# Patient Record
Sex: Female | Born: 1995 | Race: Asian | Hispanic: No | Marital: Single | Smoking: Never smoker
Health system: Southern US, Community
[De-identification: ages and names within clinical notes are randomized; demographics above are authoritative.]

## PROBLEM LIST (undated history)

## (undated) DIAGNOSIS — F32A Depression, unspecified: Secondary | ICD-10-CM

## (undated) DIAGNOSIS — J45909 Unspecified asthma, uncomplicated: Secondary | ICD-10-CM

## (undated) DIAGNOSIS — F329 Major depressive disorder, single episode, unspecified: Secondary | ICD-10-CM

## (undated) HISTORY — DX: Major depressive disorder, single episode, unspecified: F32.9

## (undated) HISTORY — DX: Depression, unspecified: F32.A

## (undated) HISTORY — DX: Unspecified asthma, uncomplicated: J45.909

## (undated) HISTORY — PX: APPENDECTOMY: SHX54

---
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
## 2013-03-02 ENCOUNTER — Encounter: Payer: Self-pay | Admitting: Radiology

## 2013-05-23 ENCOUNTER — Ambulatory Visit (INDEPENDENT_AMBULATORY_CARE_PROVIDER_SITE_OTHER): Payer: Managed Care, Other (non HMO) | Admitting: Family Medicine

## 2013-05-23 VITALS — BP 100/62 | HR 104 | Temp 98.4°F | Resp 18 | Ht 60.5 in | Wt 105.8 lb

## 2013-05-23 DIAGNOSIS — G43909 Migraine, unspecified, not intractable, without status migrainosus: Secondary | ICD-10-CM

## 2013-05-23 DIAGNOSIS — R110 Nausea: Secondary | ICD-10-CM

## 2013-05-23 DIAGNOSIS — R42 Dizziness and giddiness: Secondary | ICD-10-CM

## 2013-05-23 MED ORDER — TRAMADOL HCL 50 MG PO TABS: 50.0000 mg | ORAL_TABLET | Freq: Three times a day (TID) | ORAL | Status: DC | PRN

## 2013-05-23 NOTE — Progress Notes (Signed)
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 She describes them as migraine and the other normal headaches. They occur in the occiput or occipitoparietal areas. She gets nauseated and dizzy with the headaches. She has been on several medications from other physicians for a fairly long period of time including sumatriptan in which she takes about one a day, nortriptyline, and bupropion. She has problems with loud noises. Certain foods seem to aggravate things. Her mother does have migraines also. LMP 3 weeks ago.  Objective: Pleasant alert healthy appearing young lady in no major distress. TMs normal. Eyes PERRLA. Fundi benign. Cranial nerves 2-12 grossly intact. Romberg negative. Neck supple without nodes thyromegaly. No carotid bruits. Chest clear. Heart regular without murmurs.  Assessment: Nausea Migraines Dizziness  Plan: Avoid caffeine. Decrease frequency of sumatriptan and use. Headache analgesia with tramadol. Return if worse

## 2013-05-23 NOTE — Patient Instructions (Signed)
Decrease caffeine intake  Only use the sumatriptan once or twice a week if you have severe migraines. Take them too often this can cause rebound headaches  Tramadol every 8 hours when needed for severe pain  Take over-the-counter ibuprofen 200 mg, 3 tablets every 6 or 8 hours if needed for headache pain. Take with food.  Return if migraines or not improving

## 2013-10-05 ENCOUNTER — Ambulatory Visit (INDEPENDENT_AMBULATORY_CARE_PROVIDER_SITE_OTHER): Payer: Managed Care, Other (non HMO) | Admitting: Internal Medicine

## 2013-10-05 VITALS — BP 108/56 | HR 90 | Temp 98.5°F | Resp 18 | Ht 59.0 in | Wt 109.0 lb

## 2013-10-05 DIAGNOSIS — J019 Acute sinusitis, unspecified: Secondary | ICD-10-CM

## 2013-10-05 DIAGNOSIS — R05 Cough: Secondary | ICD-10-CM

## 2013-10-05 DIAGNOSIS — R059 Cough, unspecified: Secondary | ICD-10-CM

## 2013-10-05 DIAGNOSIS — K130 Diseases of lips: Secondary | ICD-10-CM

## 2013-10-05 MED ORDER — LIDOCAINE VISCOUS 2 % MT SOLN: OROMUCOSAL | Status: DC

## 2013-10-05 MED ORDER — AMOXICILLIN 875 MG PO TABS: 875.0000 mg | ORAL_TABLET | Freq: Two times a day (BID) | ORAL | Status: DC

## 2013-10-05 MED ORDER — HYDROCODONE-HOMATROPINE 5-1.5 MG/5ML PO SYRP: 5.0000 mL | ORAL_SOLUTION | Freq: Four times a day (QID) | ORAL | Status: DC | PRN

## 2013-10-05 NOTE — Progress Notes (Signed)
Subjective:    Patient ID: Tara Contreras, female    DOB: 1996/01/07, 18 y.o.   MRN: [id number]  HPI Patient reports with a one month history of cough and sore throat and 3 day history of sinus pressure and drainage and ear pain.  The cough is worse at night, seems to come and go, productive of yellow sputum, keeps her awake at night, spells get so bad she vomits.  The sore throat is worse with swallowing.  Positive for clear and bloody nasal drainage, post nasal drip, yellow eye drainage in the mornings only, fatigue, and sweats.  Denies fever, chills, unexpected weight loss, travel outside the country, loss of appetite.  [facility]  Review of Systems  Constitutional: Positive for fatigue. Negative for fever, chills and appetite change.  HENT: Positive for congestion, ear pain, mouth sores (right lower inner lip for one week), postnasal drip, rhinorrhea (clear or bloody), sinus pressure and sore throat (worse with swallowing). Negative for trouble swallowing.   Eyes: Positive for discharge (yellow in the morning only). Negative for pain and redness.  Respiratory: Positive for cough and shortness of breath (with rushing around only). Negative for wheezing.   Gastrointestinal: Positive for vomiting (with bad coughing spells only).  Musculoskeletal: Negative for myalgias.       Objective:   Physical Exam  Constitutional: She is oriented to person, place, and time. She appears well-developed and well-nourished. No distress.  HENT:  Head: Normocephalic and atraumatic.  Right Ear: Tympanic membrane normal.  Left Ear: Tympanic membrane normal.  Nose: Rhinorrhea present.  Mouth/Throat: Oropharynx is clear and moist. No oropharyngeal exudate.  Boggy turbinates/scant discharge Ulcer on right tonsil Ulcer on inner aspect of the left lower lip  Eyes: Conjunctivae and EOM are normal. Pupils are equal, round, and reactive to light. Right eye exhibits no discharge. Left eye  exhibits no discharge. No scleral icterus.  Neck: Normal range of motion. Neck supple. No thyromegaly present.  Cardiovascular: Normal rate, regular rhythm and normal heart sounds.  Exam reveals no gallop and no friction rub.   No murmur heard. Pulmonary/Chest: Effort normal and breath sounds normal. No respiratory distress. She has no wheezes. She has no rhonchi. She has no rales.  Lymphadenopathy:       Head (right side): No submandibular, no tonsillar, no preauricular, no posterior auricular and no occipital adenopathy present.       Head (left side): No submandibular, no tonsillar, no preauricular, no posterior auricular and no occipital adenopathy present.    She has no cervical adenopathy.  Neurological: She is alert and oriented to person, place, and time.  Skin: Skin is warm and dry.  Psychiatric: She has a normal mood and affect. Her behavior is normal. Judgment and thought content normal.      Assessment & Plan:   Acute sinusitis, prolonged  Lip ulcer  Cough secondary  Meds ordered this encounter  Medications  . amoxicillin (AMOXIL) 875 MG tablet    Sig: Take 1 tablet (875 mg total) by mouth 2 (two) times daily.    Dispense:  20 tablet    Refill:  0  . lidocaine (XYLOCAINE) 2 % solution    Sig: Apply to ulcers q 2 h as needed for pain    Dispense:  60 mL    Refill:  0  . HYDROcodone-homatropine (HYCODAN) 5-1.5 MG/5ML syrup    Sig: Take 5 mLs by mouth every 6 (six) hours as needed for cough.  Dispense:  120 mL    Refill:  0   followup 2 weeks if not well  I have completed the patient encounter in its entirety as documented by the FNP-S, with editing by me where necessary. Tara Contreras, M.D.

## 2014-10-02 ENCOUNTER — Other Ambulatory Visit: Payer: Self-pay | Admitting: Gastroenterology

## 2014-10-02 DIAGNOSIS — R140 Abdominal distension (gaseous): Secondary | ICD-10-CM

## 2014-10-03 ENCOUNTER — Encounter: Payer: Self-pay | Admitting: *Deleted

## 2014-10-03 NOTE — Progress Notes (Signed)
Patient has transferred gi care to Dr Danise EdgeMartin Johnson. She saw him 10/02/14.

## 2014-10-09 ENCOUNTER — Ambulatory Visit
Admission: RE | Admit: 2014-10-09 | Discharge: 2014-10-09 | Disposition: A | Discharge status: Home / Self Care | Payer: Managed Care, Other (non HMO) | Source: Ambulatory Visit | Attending: Gastroenterology | Admitting: Gastroenterology

## 2014-10-09 ENCOUNTER — Other Ambulatory Visit: Payer: Self-pay | Admitting: Gastroenterology

## 2014-10-09 DIAGNOSIS — R1032 Left lower quadrant pain: Secondary | ICD-10-CM

## 2014-10-09 DIAGNOSIS — R140 Abdominal distension (gaseous): Secondary | ICD-10-CM

## 2014-10-09 DIAGNOSIS — R195 Other fecal abnormalities: Secondary | ICD-10-CM

## 2014-10-11 ENCOUNTER — Ambulatory Visit
Admission: RE | Admit: 2014-10-11 | Discharge: 2014-10-11 | Disposition: A | Discharge status: Home / Self Care | Payer: Managed Care, Other (non HMO) | Source: Ambulatory Visit | Attending: Gastroenterology | Admitting: Gastroenterology

## 2014-10-11 DIAGNOSIS — R1032 Left lower quadrant pain: Secondary | ICD-10-CM

## 2014-10-11 DIAGNOSIS — R195 Other fecal abnormalities: Secondary | ICD-10-CM

## 2014-11-11 ENCOUNTER — Ambulatory Visit (INDEPENDENT_AMBULATORY_CARE_PROVIDER_SITE_OTHER): Payer: Managed Care, Other (non HMO) | Admitting: Family Medicine

## 2014-11-11 VITALS — BP 100/60 | HR 79 | Temp 97.9°F | Resp 16 | Ht 60.25 in | Wt 114.0 lb

## 2014-11-11 DIAGNOSIS — Z111 Encounter for screening for respiratory tuberculosis: Secondary | ICD-10-CM | POA: Diagnosis not present

## 2014-11-11 NOTE — Patient Instructions (Signed)
Good to see you today- please come back for your TB read as directed

## 2014-11-11 NOTE — Progress Notes (Signed)
[facility] [address], [place] [id number] [phone]  Date:  11/11/2014   Name:  Tara RutterHnen Difrancesco   DOB:  Sep 06, 1995   MRN:  [id number]  PCP:  Tally DueGUEST, CHRIS WARREN, MD    Chief Complaint: PPD Placement   History of Present Illness:  Tara Contreras is a 19 y.o. 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  She has never tested positive in the past.  She was last tested in middle school.  She does not need anything else today  She has no sx of TB  Patient Active Problem List   Diagnosis Date Noted  . Migraine, unspecified, without mention of intractable migraine without mention of status migrainosus 05/23/2013  . Chronic tension headaches treated [facility] Levert FeinsteinYIjun Yan [facility] phone [phone] 06/07/2012    Past Medical History  Diagnosis Date  . Depression   . Asthma     Past Surgical History  Procedure Laterality Date  . Appendectomy      History  Substance Use Topics  . Smoking status: Never Smoker   . Smokeless tobacco: Never Used  . Alcohol Use: No     Comment: socailly    History reviewed. No pertinent family history.  No Known Allergies  Medication list has been reviewed and updated.  Current Outpatient Prescriptions on File Prior to Visit  Medication Sig Dispense Refill  . SUMAtriptan (IMITREX) 50 MG tablet Take 50 mg by mouth every 2 (two) hours as needed for migraine. May repeat in 2 hours if headache persists or recurs.    . traMADol (ULTRAM) 50 MG tablet Take 1 tablet (50 mg total) by mouth every 8 (eight) hours as needed for pain. (Patient not taking: Reported on 11/11/2014) 30 tablet 0   No current facility-administered medications on file prior to visit.    Review of Systems:  As per HPI- otherwise negative.   Physical Examination: Filed Vitals:   11/11/14 1258  BP: 100/60  Pulse: 79  Temp: 97.9 F (36.6 C)  Resp: 16   Filed Vitals:   11/11/14 1258   Height: 5' 0.25" (1.53 m)  Weight: 114 lb (51.71 kg)   Body mass index is 22.09 kg/(m^2). Ideal Body Weight: Weight in (lb) to have BMI = 25: 128.8  GEN: WDWN, NAD, Non-toxic, A & O x 3 HEENT: Atraumatic, Normocephalic. Neck supple. No masses, No LAD. Ears and Nose: No external deformity. CV: RRR, No M/G/R. No JVD. No thrill. No extra heart sounds. PULM: CTA B, no wheezes, crackles, rhonchi. No retractions. No resp. distress. No accessory muscle use. EXTR: No c/c/e NEURO Normal gait.  PSYCH: Normally interactive. Conversant. Not depressed or anxious appearing.  Calm demeanor.    Assessment and Plan: Screening-pulmonary TB - Plan: TB Skin Test   Placed PPD today for TB screening.  She has no other concerns today  Signed Abbe AmsterdamJessica Copland, MD

## 2014-11-11 NOTE — Progress Notes (Signed)
  Tuberculosis Risk Questionnaire  1. Yes  Were you born outside the [place] in one of the following parts of the world: [place], [place], [place], [place] or [place]? [place]    2. No Have you traveled outside the [place] and lived for more than one month in one of the following parts of the world: [place], [place], [place], [place] or [place]?    3. No Do you have a compromised immune system such as from any of the following conditions:HIV/AIDS, organ or bone marrow transplantation, diabetes, immunosuppressive medicines (e.g. Prednisone, Remicaide), leukemia, lymphoma, cancer of the head or neck, gastrectomy or jejunal bypass, end-stage renal disease (on dialysis), or silicosis?     4. Yes  Have you ever or do you plan on working in: a residential care center, a health care facility, a jail or prison or homeless shelter?    5. No Have you ever: injected illegal drugs, used crack cocaine, lived in a homeless shelter  or been in jail or prison?     6. No Have you ever been exposed to anyone with infectious tuberculosis?    Tuberculosis Symptom Questionnaire  Do you currently have any of the following symptoms?  1. No Unexplained cough lasting more than 3 weeks?   2. No Unexplained fever lasting more than 3 weeks.   3. No Night Sweats (sweating that leaves the bedclothes and sheets wet)     4. No Shortness of Breath   5. No Chest Pain   6. No Unintentional weight loss    7. No Unexplained fatigue (very tired for no reason)

## 2014-11-13 ENCOUNTER — Ambulatory Visit: Payer: Managed Care, Other (non HMO) | Admitting: *Deleted

## 2014-11-13 DIAGNOSIS — Z111 Encounter for screening for respiratory tuberculosis: Secondary | ICD-10-CM

## 2014-11-13 LAB — TB SKIN TEST
Induration: 0 mm
TB Skin Test: NEGATIVE

## 2014-11-13 NOTE — Progress Notes (Signed)
PPD reading was negative with 0 mm induration. 

## 2014-11-13 NOTE — Progress Notes (Deleted)
   Subjective:    Patient ID: Tara Contreras, female    DOB: 11-May-1996, 18 y.o.   MRN: [id number]  HPI    Review of Systems     Objective:   Physical Exam        Assessment & Plan:

## 2014-11-19 ENCOUNTER — Ambulatory Visit (INDEPENDENT_AMBULATORY_CARE_PROVIDER_SITE_OTHER): Payer: Managed Care, Other (non HMO) | Admitting: Family Medicine

## 2014-11-19 ENCOUNTER — Ambulatory Visit (INDEPENDENT_AMBULATORY_CARE_PROVIDER_SITE_OTHER): Payer: Managed Care, Other (non HMO)

## 2014-11-19 DIAGNOSIS — M542 Cervicalgia: Secondary | ICD-10-CM

## 2014-11-19 DIAGNOSIS — W2210XA Striking against or struck by unspecified automobile airbag, initial encounter: Secondary | ICD-10-CM

## 2014-11-19 DIAGNOSIS — M79601 Pain in right arm: Secondary | ICD-10-CM | POA: Diagnosis not present

## 2014-11-19 MED ORDER — METHOCARBAMOL 500 MG PO TABS: ORAL_TABLET | ORAL | Status: AC

## 2014-11-19 NOTE — Patient Instructions (Signed)
Take Aleve (naproxen) 220 mg 2 pills twice daily with food for pain and inflammation and neck  Take Tylenol 1000 mg 3 times daily (2500 mg) in addition to the Aleve if necessary for more pain relief  Tomorrow begin taking the muscle relaxant, methocarbamol, 500 mg in the morning, 500 mg in the afternoon, and 1000 mg (2500 mg) at bedtime  Return if not improving or if significantly worse at any time
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
## 2015-06-01 ENCOUNTER — Emergency Department (HOSPITAL_COMMUNITY): Payer: Managed Care, Other (non HMO)

## 2015-06-01 ENCOUNTER — Emergency Department (HOSPITAL_COMMUNITY)
Admission: EM | Admit: 2015-06-01 | Discharge: 2015-06-02 | Disposition: A | Discharge status: Home / Self Care | Payer: Managed Care, Other (non HMO) | Attending: Emergency Medicine | Admitting: Emergency Medicine

## 2015-06-01 ENCOUNTER — Encounter (HOSPITAL_COMMUNITY): Payer: Self-pay | Admitting: Emergency Medicine

## 2015-06-01 DIAGNOSIS — J40 Bronchitis, not specified as acute or chronic: Secondary | ICD-10-CM | POA: Insufficient documentation

## 2015-06-01 DIAGNOSIS — Z3202 Encounter for pregnancy test, result negative: Secondary | ICD-10-CM | POA: Insufficient documentation

## 2015-06-01 DIAGNOSIS — Z23 Encounter for immunization: Secondary | ICD-10-CM | POA: Insufficient documentation

## 2015-06-01 DIAGNOSIS — Z8659 Personal history of other mental and behavioral disorders: Secondary | ICD-10-CM | POA: Diagnosis not present

## 2015-06-01 DIAGNOSIS — R1110 Vomiting, unspecified: Secondary | ICD-10-CM | POA: Diagnosis present

## 2015-06-01 LAB — COMPREHENSIVE METABOLIC PANEL
ALT: 13 U/L — ABNORMAL LOW (ref 14–54)
AST: 24 U/L (ref 15–41)
Albumin: 3.6 g/dL (ref 3.5–5.0)
Alkaline Phosphatase: 46 U/L (ref 38–126)
Anion gap: 9 (ref 5–15)
BUN: 7 mg/dL (ref 6–20)
CO2: 27 mmol/L (ref 22–32)
Calcium: 8.9 mg/dL (ref 8.9–10.3)
Chloride: 105 mmol/L (ref 101–111)
Creatinine, Ser: 0.72 mg/dL (ref 0.44–1.00)
GFR calc Af Amer: >60 mL/min (ref >60)
GFR calc non Af Amer: >60 mL/min (ref >60)
Glucose, Bld: 106 mg/dL — ABNORMAL HIGH (ref 65–99)
Potassium: 3.8 mmol/L (ref 3.5–5.1)
Sodium: 141 mmol/L (ref 135–145)
Total Bilirubin: 0.5 mg/dL (ref 0.3–1.2)
Total Protein: 7.7 g/dL (ref 6.5–8.1)

## 2015-06-01 LAB — CBC
HCT: 36.3 % (ref 36.0–46.0)
Hemoglobin: 11.7 g/dL — ABNORMAL LOW (ref 12.0–15.0)
MCH: 23.4 pg — ABNORMAL LOW (ref 26.0–34.0)
MCHC: 32.2 g/dL (ref 30.0–36.0)
MCV: 72.7 fL — ABNORMAL LOW (ref 78.0–100.0)
Platelets: 440 10*3/uL — ABNORMAL HIGH (ref 150–400)
RBC: 4.99 MIL/uL (ref 3.87–5.11)
RDW: 13.5 % (ref 11.5–15.5)
WBC: 7 10*3/uL (ref 4.0–10.5)

## 2015-06-01 LAB — URINALYSIS, ROUTINE W REFLEX MICROSCOPIC
Bilirubin Urine: NEGATIVE
Glucose, UA: NEGATIVE mg/dL
Hgb urine dipstick: NEGATIVE
Ketones, ur: NEGATIVE mg/dL
Leukocytes, UA: NEGATIVE
Nitrite: NEGATIVE
Protein, ur: NEGATIVE mg/dL
Specific Gravity, Urine: 1.01 (ref 1.005–1.030)
Urobilinogen, UA: 0.2 mg/dL (ref 0.0–1.0)
pH: 6.5 (ref 5.0–8.0)

## 2015-06-01 LAB — LIPASE, BLOOD: Lipase: 27 U/L (ref 11–51)

## 2015-06-01 LAB — I-STAT BETA HCG BLOOD, ED (MC, WL, AP ONLY): I-stat hCG, quantitative: <5 m[IU]/mL (ref <5)

## 2015-06-01 MED ORDER — FEXOFENADINE-PSEUDOEPHED ER 180-240 MG PO TB24: 1.0000 | ORAL_TABLET | Freq: Every day | ORAL | Status: AC | PRN

## 2015-06-01 MED ORDER — ONDANSETRON HCL 4 MG/2ML IJ SOLN
4.0000 mg | Freq: Once | INTRAMUSCULAR | Status: AC
  Administered 2015-06-01: 4 mg via INTRAVENOUS
  Filled 2015-06-01: qty 2

## 2015-06-01 MED ORDER — SODIUM CHLORIDE 0.9 % IV BOLUS (SEPSIS)
1000.0000 mL | Freq: Once | INTRAVENOUS | Status: AC
  Administered 2015-06-01: 1000 mL via INTRAVENOUS

## 2015-06-01 MED ORDER — DEXAMETHASONE SODIUM PHOSPHATE 10 MG/ML IJ SOLN
10.0000 mg | Freq: Once | INTRAMUSCULAR | Status: AC
  Administered 2015-06-01: 10 mg via INTRAVENOUS
  Filled 2015-06-01: qty 1

## 2015-06-01 MED ORDER — MORPHINE SULFATE (PF) 4 MG/ML IV SOLN
4.0000 mg | Freq: Once | INTRAVENOUS | Status: AC
  Administered 2015-06-01: 4 mg via INTRAVENOUS
  Filled 2015-06-01: qty 1

## 2015-06-01 MED ORDER — DEXAMETHASONE 4 MG PO TABS: 4.0000 mg | ORAL_TABLET | Freq: Two times a day (BID) | ORAL | Status: AC

## 2015-06-01 MED ORDER — KETOROLAC TROMETHAMINE 15 MG/ML IJ SOLN
15.0000 mg | Freq: Once | INTRAMUSCULAR | Status: AC
  Administered 2015-06-01: 15 mg via INTRAVENOUS
  Filled 2015-06-01: qty 1

## 2015-06-01 NOTE — Discharge Instructions (Signed)
Upper Respiratory Infection, Adult Most upper respiratory infections (URIs) are a viral infection of the air passages leading to the lungs. A URI affects the nose, throat, and upper air passages. The most common type of URI is nasopharyngitis and is typically referred to as "the common cold." URIs run their course and usually go away on their own. Most of the time, a URI does not require medical attention, but sometimes a bacterial infection in the upper airways can follow a viral infection. This is called a secondary infection. Sinus and middle ear infections are common types of secondary upper respiratory infections. Bacterial pneumonia can also complicate a URI. A URI can worsen asthma and chronic obstructive pulmonary disease (COPD). Sometimes, these complications can require emergency medical care and may be life threatening.  CAUSES Almost all URIs are caused by viruses. A virus is a type of germ and can spread from one person to another.  RISKS FACTORS You may be at risk for a URI if:   You smoke.   You have chronic heart or lung disease.  You have a weakened defense (immune) system.   You are very young or very old.   You have nasal allergies or asthma.  You work in crowded or poorly ventilated areas.  You work in health care facilities or schools. SIGNS AND SYMPTOMS  Symptoms typically develop 2-3 days after you come in contact with a cold virus. Most viral URIs last 7-10 days. However, viral URIs from the influenza virus (flu virus) can last 14-18 days and are typically more severe. Symptoms may include:   Runny or stuffy (congested) nose.   Sneezing.   Cough.   Sore throat.   Headache.   Fatigue.   Fever.   Loss of appetite.   Pain in your forehead, behind your eyes, and over your cheekbones (sinus pain).  Muscle aches.  DIAGNOSIS  Your health care provider may diagnose a URI by:  Physical exam.  Tests to check that your symptoms are not due to  another condition such as:  Strep throat.  Sinusitis.  Pneumonia.  Asthma. TREATMENT  A URI goes away on its own with time. It cannot be cured with medicines, but medicines may be prescribed or recommended to relieve symptoms. Medicines may help:  Reduce your fever.  Reduce your cough.  Relieve nasal congestion. HOME CARE INSTRUCTIONS   Take medicines only as directed by your health care provider.   Gargle warm saltwater or take cough drops to comfort your throat as directed by your health care provider.  Use a warm mist humidifier or inhale steam from a shower to increase air moisture. This may make it easier to breathe.  Drink enough fluid to keep your urine clear or pale yellow.   Eat soups and other clear broths and maintain good nutrition.   Rest as needed.   Return to work when your temperature has returned to normal or as your health care provider advises. You may need to stay home longer to avoid infecting others. You can also use a face mask and careful hand washing to prevent spread of the virus.  Increase the usage of your inhaler if you have asthma.   Do not use any tobacco products, including cigarettes, chewing tobacco, or electronic cigarettes. If you need help quitting, ask your health care provider. PREVENTION  The best way to protect yourself from getting a cold is to practice good hygiene.   Avoid oral or hand contact with people with cold   symptoms.   Wash your hands often if contact occurs.  There is no clear evidence that vitamin C, vitamin E, echinacea, or exercise reduces the chance of developing a cold. However, it is always recommended to get plenty of rest, exercise, and practice good nutrition.  SEEK MEDICAL CARE IF:   You are getting worse rather than better.   Your symptoms are not controlled by medicine.   You have chills.  You have worsening shortness of breath.  You have brown or red mucus.  You have yellow or brown nasal  discharge.  You have pain in your face, especially when you bend forward.  You have a fever.  You have swollen neck glands.  You have pain while swallowing.  You have white areas in the back of your throat. SEEK IMMEDIATE MEDICAL CARE IF:   You have severe or persistent:  Headache.  Ear pain.  Sinus pain.  Chest pain.  You have chronic lung disease and any of the following:  Wheezing.  Prolonged cough.  Coughing up blood.  A change in your usual mucus.  You have a stiff neck.  You have changes in your:  Vision.  Hearing.  Thinking.  Mood. MAKE SURE YOU:   Understand these instructions.  Will watch your condition.  Will get help right away if you are not doing well or get worse.   This information is not intended to replace advice given to you by your health care provider. Make sure you discuss any questions you have with your health care provider.   Document Released: 01/05/2001 Document Revised: 11/26/2014 Document Reviewed: 10/17/2013 Elsevier Interactive Patient Education 2016 Elsevier Inc.  

## 2015-06-01 NOTE — ED Notes (Signed)
Pt from home for eval of ongoing nausea and emesis x3 months, pt [facility] and was given meds but still isn't feeling better. Pt denies any diarrhea or fevers, LMP was in October 2016. Pt also reports generalized abd pain. Pt states she feels weak all over. Skin warm and dry.

## 2015-06-13 NOTE — ED Provider Notes (Signed)
CSN: [id number]     Arrival date & time 06/01/15  2136 History   First MD Initiated Contact with Patient 06/01/15 2201     Chief Complaint  Patient presents with  . Emesis     (Consider location/radiation/quality/duration/timing/severity/associated sxs/prior Treatment) HPI   19 year old female with ongoing nausea and vomiting for the past few weeks. Worsening within the past couple weeks. Also associated with cough and occasional sharp chest pain. Feels generally weak. Cough is productive for whitish sputum. Subjective fever. No unusual leg pain or swelling. Recently seen in urgent care and placed on antibiotics. She reports compliance with medicines without much improvement. Otherwise healthy. Patient is an immigrant from [place] but she reports her immunizations are up-to-date.  Past Medical History  Diagnosis Date  . Depression   . Asthma    Past Surgical History  Procedure Laterality Date  . Appendectomy     No family history on file. Social History  Substance Use Topics  . Smoking status: Never Smoker   . Smokeless tobacco: Never Used  . Alcohol Use: No     Comment: socailly   OB History    No data available     Review of Systems  All systems reviewed and negative, other than as noted in HPI.   Allergies  Review of patient's allergies indicates no known allergies.  Home Medications   Prior to Admission medications   Medication Sig Start Date End Date Taking? Authorizing Provider  benzonatate (TESSALON) 200 MG capsule Take 200 mg by mouth 3 (three) times daily as needed for cough.   Yes Historical Provider, MD  dexamethasone (DECADRON) 4 MG tablet Take 1 tablet (4 mg total) by mouth 2 (two) times daily. 06/01/15   Raeford Razor, MD  fexofenadine-pseudoephedrine (ALLEGRA-D ALLERGY & CONGESTION) 180-240 MG 24 hr tablet Take 1 tablet by mouth daily as needed. 06/01/15   Raeford Razor, MD  methocarbamol (ROBAXIN) 500 MG tablet Take 1 in the morning, 1 in the afternoon,  and 2 at bedtime for muscle relaxant Patient not taking: Reported on 06/01/2015 11/19/14   Peyton Najjar, MD   BP 95/59 mmHg  Pulse 68  Temp(Src) 98.6 F (37 C) (Oral)  Resp 20  Wt 113 lb 3 oz (51.342 kg)  SpO2 100%  LMP 05/11/2015 Physical Exam  Constitutional: She appears well-developed and well-nourished. No distress.  HENT:  Head: Normocephalic and atraumatic.  Eyes: Conjunctivae are normal. Right eye exhibits no discharge. Left eye exhibits no discharge.  Neck: Neck supple.  Cardiovascular: Normal rate, regular rhythm and normal heart sounds.  Exam reveals no gallop and no friction rub.   No murmur heard. Pulmonary/Chest: Effort normal. No respiratory distress. She has wheezes.  Faint scattered wheezing bilaterally. Normal work of breathing. Speaking complete sentences.  Abdominal: Soft. She exhibits no distension. There is no tenderness.  Musculoskeletal: She exhibits no edema or tenderness.  Lower extremities symmetric as compared to each other. No calf tenderness. Negative Homan's. No palpable cords.   Neurological: She is alert.  Skin: Skin is warm and dry.  Psychiatric: She has a normal mood and affect. Her behavior is normal. Thought content normal.  Nursing note and vitals reviewed.   ED Course  Procedures (including critical care time) Labs Review Labs Reviewed  COMPREHENSIVE METABOLIC PANEL - Abnormal; Notable for the following:    Glucose, Bld 106 (*)    ALT 13 (*)    All other components within normal limits  CBC - Abnormal; Notable for the following:  Hemoglobin 11.7 (*)    MCV 72.7 (*)    MCH 23.4 (*)    Platelets 440 (*)    All other components within normal limits  LIPASE, BLOOD  URINALYSIS, ROUTINE W REFLEX MICROSCOPIC (NOT AT [facility])  I-STAT BETA HCG BLOOD, ED ([facility], [facility], [facility] ONLY)    Imaging Review No results found. I have personally reviewed and evaluated these images and lab results as part of my medical decision-making.   EKG  Interpretation None      MDM   Final diagnoses:  Bronchitis    19 year old female with likely viral bronchitis. No increased work of breathing. Normal oxygen saturations on room air. Previously prescribed Levaquin. Advised to continue this. Also started on steroids. Return precautions discussed.    Raeford RazorStephen Chistine Dematteo, MD 06/13/15 [phone]

## 2015-09-01 ENCOUNTER — Ambulatory Visit (INDEPENDENT_AMBULATORY_CARE_PROVIDER_SITE_OTHER): Payer: Managed Care, Other (non HMO) | Admitting: Family Medicine

## 2015-09-01 VITALS — BP 110/74 | HR 89 | Temp 98.3°F | Resp 18 | Ht 59.0 in | Wt 111.0 lb

## 2015-09-01 DIAGNOSIS — R110 Nausea: Secondary | ICD-10-CM | POA: Diagnosis not present

## 2015-09-01 DIAGNOSIS — R197 Diarrhea, unspecified: Secondary | ICD-10-CM | POA: Diagnosis not present

## 2015-09-01 DIAGNOSIS — D62 Acute posthemorrhagic anemia: Secondary | ICD-10-CM

## 2015-09-01 DIAGNOSIS — R109 Unspecified abdominal pain: Secondary | ICD-10-CM | POA: Diagnosis not present

## 2015-09-01 DIAGNOSIS — R1084 Generalized abdominal pain: Secondary | ICD-10-CM | POA: Diagnosis not present

## 2015-09-01 DIAGNOSIS — R42 Dizziness and giddiness: Secondary | ICD-10-CM

## 2015-09-01 LAB — COMPLETE METABOLIC PANEL WITH GFR
ALBUMIN: 3.8 g/dL (ref 3.6–5.1)
ALK PHOS: 48 U/L (ref 33–115)
ALT: 6 U/L (ref 6–29)
AST: 12 U/L (ref 10–30)
BUN: 8 mg/dL (ref 7–25)
CHLORIDE: 102 mmol/L (ref 98–110)
CO2: 29 mmol/L (ref 20–31)
Calcium: 9.2 mg/dL (ref 8.6–10.2)
Creat: 0.56 mg/dL (ref 0.50–1.10)
GFR, Est African American: >89 mL/min (ref >=60)
Glucose, Bld: 86 mg/dL (ref 65–99)
POTASSIUM: 4.8 mmol/L (ref 3.5–5.3)
SODIUM: 138 mmol/L (ref 135–146)
Total Bilirubin: 0.5 mg/dL (ref 0.2–1.2)
Total Protein: 6.8 g/dL (ref 6.1–8.1)

## 2015-09-01 LAB — POC MICROSCOPIC URINALYSIS (UMFC): MUCUS RE: ABSENT

## 2015-09-01 LAB — POCT URINALYSIS DIP (MANUAL ENTRY)
BILIRUBIN UA: NEGATIVE
GLUCOSE UA: NEGATIVE
Ketones, POC UA: NEGATIVE
LEUKOCYTES UA: NEGATIVE
NITRITE UA: NEGATIVE
PH UA: 6
Protein Ur, POC: NEGATIVE
Spec Grav, UA: 1.02
Urobilinogen, UA: 0.2

## 2015-09-01 LAB — POCT URINE PREGNANCY: Preg Test, Ur: NEGATIVE

## 2015-09-01 LAB — POCT CBC
GRANULOCYTE PERCENT: 65.5 % (ref 37–80)
HEMATOCRIT: 35 % — AB (ref 37.7–47.9)
Hemoglobin: 11.5 g/dL — AB (ref 12.2–16.2)
LYMPH, POC: 1.7 (ref 0.6–3.4)
MCH, POC: 23.8 pg — AB (ref 27–31.2)
MCHC: 32.8 g/dL (ref 31.8–35.4)
MCV: 72.3 fL — AB (ref 80–97)
MID (CBC): 0.6 (ref 0–0.9)
MPV: 6.7 fL (ref 0–99.8)
POC GRANULOCYTE: 4.5 (ref 2–6.9)
POC LYMPH PERCENT: 25.3 %L (ref 10–50)
POC MID %: 9.2 % (ref 0–12)
Platelet Count, POC: 287 10*3/uL (ref 142–424)
RBC: 4.84 M/uL (ref 4.04–5.48)
RDW, POC: 14.3 %
WBC: 6.8 10*3/uL (ref 4.6–10.2)

## 2015-09-01 LAB — LIPASE: Lipase: 5 U/L — ABNORMAL LOW (ref 7–60)

## 2015-09-01 MED ORDER — DICYCLOMINE HCL 10 MG PO CAPS: 10.0000 mg | ORAL_CAPSULE | Freq: Three times a day (TID) | ORAL | Status: AC

## 2015-09-01 NOTE — Progress Notes (Signed)
Patient ID: Tara Contreras, female    DOB: [birth year]  Age: 20 y.o. MRN: [id number]  Chief Complaint  Patient presents with  . Abdominal Pain    since last Thursday, nausea, and diarrhea.   . Dizziness    Subjective:   20 year old lady who has been sick since Thursday. She has had generalized abdominal pain. She has nausea without vomiting. She's been having diarrhea. Her last menstrual cycle was about a month ago. She is sexually involved and does use protection..  She is not taking any regular medicines. She is a Consulting civil engineer at [employer]. Her pains been primarily in the epigastrium but generalized abdominal pain also. She hurts in both flanks some. Had a little fever yesterday she says. She did have chills Thursday and Friday and some fever since then. She has continued to try to drink plenty of fluids with enough electrolytes.  She's been having a lot of trouble with dizziness though that has subsided right now.  Current allergies, medications, problem list, past/family and social histories reviewed.  Objective:  BP 110/74 mmHg  Pulse 89  Temp(Src) 98.3 F (36.8 C) (Oral)  Resp 18  Ht  (1.499 m)  Wt 111 lb (50.349 kg)  BMI 22.41 kg/m2  SpO2 98%  LMP 08/01/2015  Alert and oriented. Not particularly ill appearing. HEENT normal. Throat clear. Neck supple without nodes. Chest clear to auscultation. Heart rate without murmur. Abdomen soft without masses but has generalized tenderness. Bowel sounds present. She had diarrhea before coming in here today.  Assessment & Plan:   Assessment: 1. Generalized abdominal pain   2. Nausea without vomiting   3. Diarrhea, unspecified type   4. Dizziness   5. Flank pain   6. Acute blood loss anemia       Plan:  Probably gastroenteritis.  Check labs.  Orders Placed This Encounter  Procedures  . COMPLETE METABOLIC PANEL WITH GFR  . Lipase  . POCT CBC  . POCT urine pregnancy  . POCT urinalysis dipstick  . POCT Microscopic Urinalysis ([facility])    Results for orders placed or performed in visit on 09/01/15  POCT CBC  Result Value Ref Range   WBC 6.8 4.6 - 10.2 K/uL   Lymph, poc 1.7 0.6 - 3.4   POC LYMPH PERCENT 25.3 10 - 50 %L   MID (cbc) 0.6 0 - 0.9   POC MID % 9.2 0 - 12 %M   POC Granulocyte 4.5 2 - 6.9   Granulocyte percent 65.5 37 - 80 %G   RBC 4.84 4.04 - 5.48 M/uL   Hemoglobin 11.5 (A) 12.2 - 16.2 g/dL   HCT, POC 40.9 (A) 81.1 - 47.9 %   MCV 72.3 (A) 80 - 97 fL   MCH, POC 23.8 (A) 27 - 31.2 pg   MCHC 32.8 31.8 - 35.4 g/dL   RDW, POC 91.4 %   Platelet Count, POC 287 142 - 424 K/uL   MPV 6.7 0 - 99.8 fL  POCT urine pregnancy  Result Value Ref Range   Preg Test, Ur Negative Negative  POCT urinalysis dipstick  Result Value Ref Range   Color, UA yellow yellow   Clarity, UA clear clear   Glucose, UA negative negative   Bilirubin, UA negative negative   Ketones, POC UA negative negative   Spec Grav, UA 1.020    Blood, UA moderate (A) negative   pH, UA 6.0    Protein Ur, POC negative negative   Urobilinogen, UA 0.2  Nitrite, UA Negative Negative   Leukocytes, UA Negative Negative  POCT Microscopic Urinalysis ([facility])  Result Value Ref Range   WBC,UR,HPF,POC None None WBC/hpf   RBC,UR,HPF,POC Few (A) None RBC/hpf   Bacteria None None, Too numerous to count   Mucus Absent Absent   Epithelial Cells, UR Per Microscopy Few (A) None, Too numerous to count cells/hpf     Meds ordered this encounter  Medications  . dicyclomine (BENTYL) 10 MG capsule    Sig: Take 1 capsule (10 mg total) by mouth 4 (four) times daily -  before meals and at bedtime.    Dispense:  30 capsule    Refill:  0    Findings are consistent with a gastroenteritis with prolonged symptoms. She is also anemic. The combined effect is the dizziness also. See instructions.     Patient Instructions  Drink plenty of fluids  Take the dicyclomine (Bentyl) 1 before meals and at bedtime as needed for diarrhea and cramping. Sometimes people  complain that it makes them mouth feel dry.  If worse abdominal pain or diarrhea please return  Once your stomach has been doing well you can discontinue taking the dicyclomine. After it is been doing well for about a week you can begin taking over-the-counter iron one twice daily for 1 month, then 1 daily for about 3 months. This is to try and rebuild your bone marrow iron stores. Plan to return in late March or early April to follow-up on your blood count.     Return if symptoms worsen or fail to improve.   Cheyrl Buley, MD 09/01/2015

## 2015-09-01 NOTE — Patient Instructions (Signed)
Drink plenty of fluids  Take the dicyclomine (Bentyl) 1 before meals and at bedtime as needed for diarrhea and cramping. Sometimes people complain that it makes them mouth feel dry.  If worse abdominal pain or diarrhea please return  Once your stomach has been doing well you can discontinue taking the dicyclomine. After it is been doing well for about a week you can begin taking over-the-counter iron one twice daily for 1 month, then 1 daily for about 3 months. This is to try and rebuild your bone marrow iron stores. Plan to return in late March or early April to follow-up on your blood count.

## 2016-06-07 IMAGING — DX DG CHEST 2V
2 series · 2 of 2 positions shown · non-contrast
Comparison: None.

CLINICAL DATA: Cough for 3 weeks.  Dyspnea and fever

EXAM:
CHEST  2 VIEW

[chest pa]
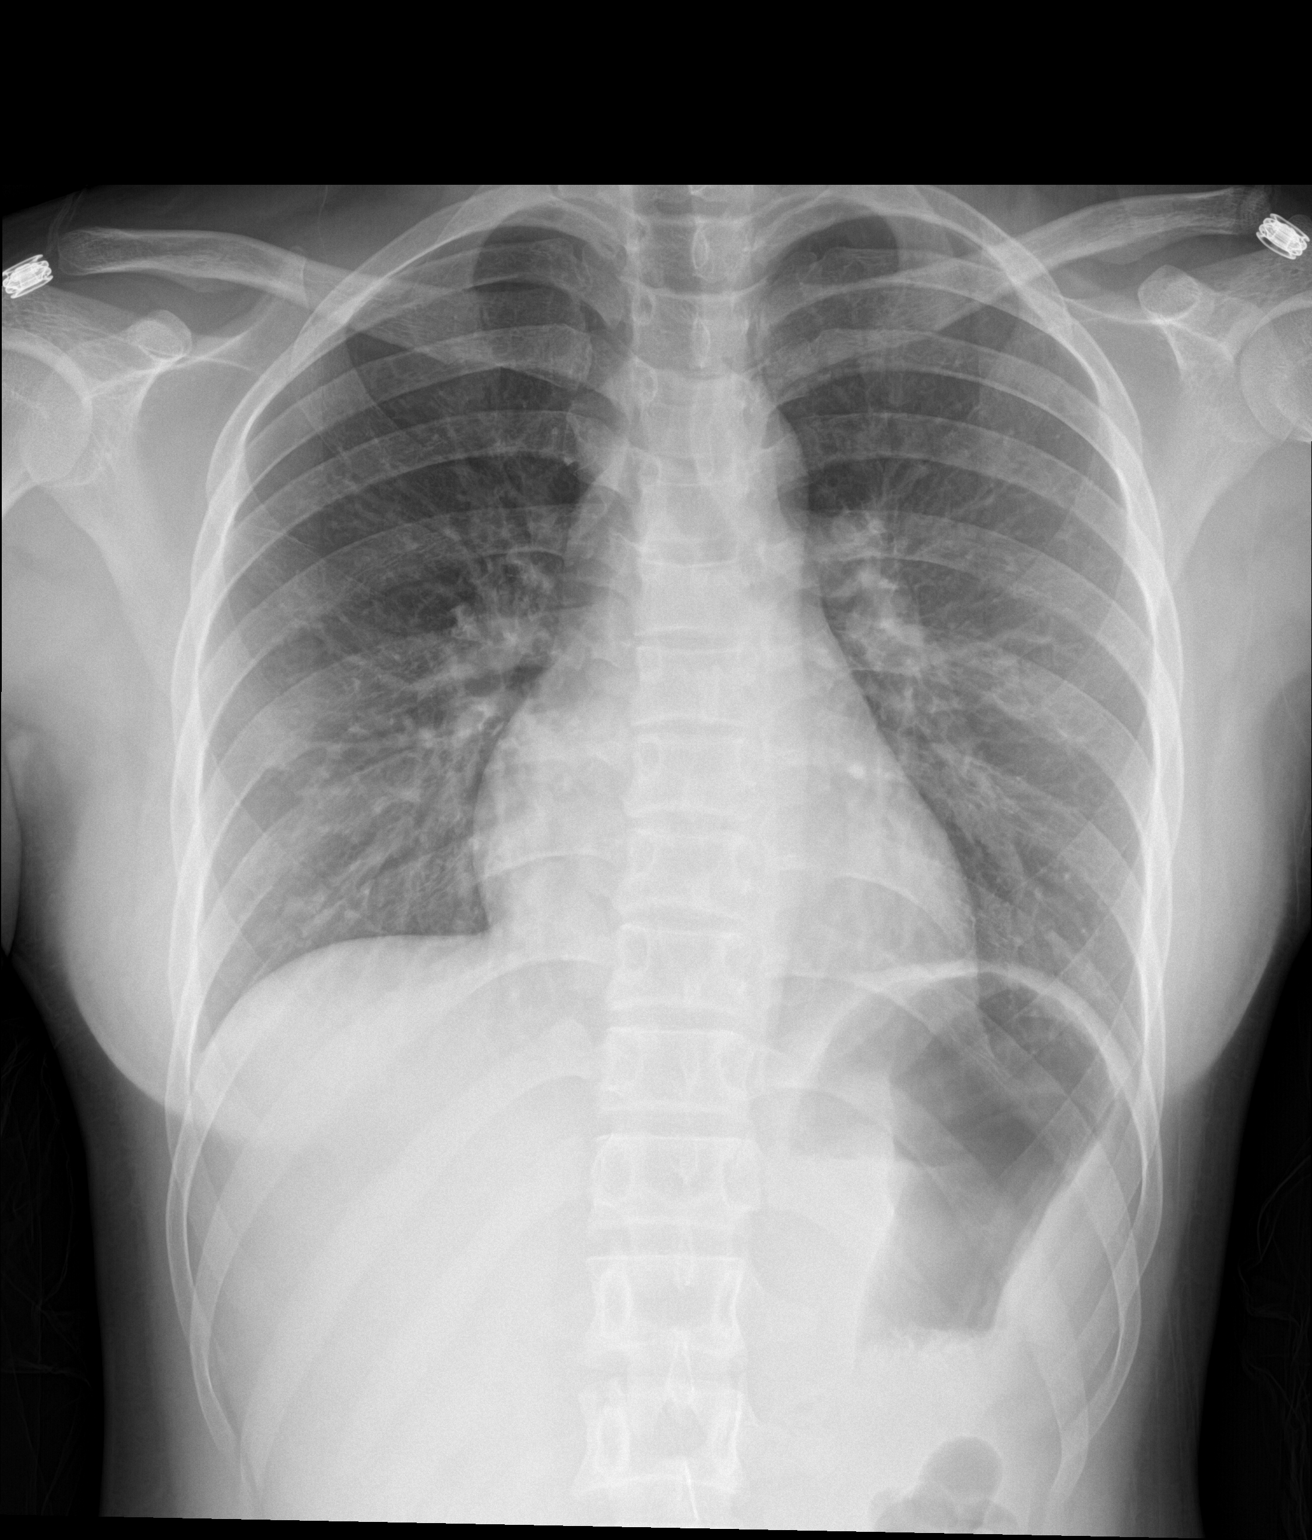

[chest lat]
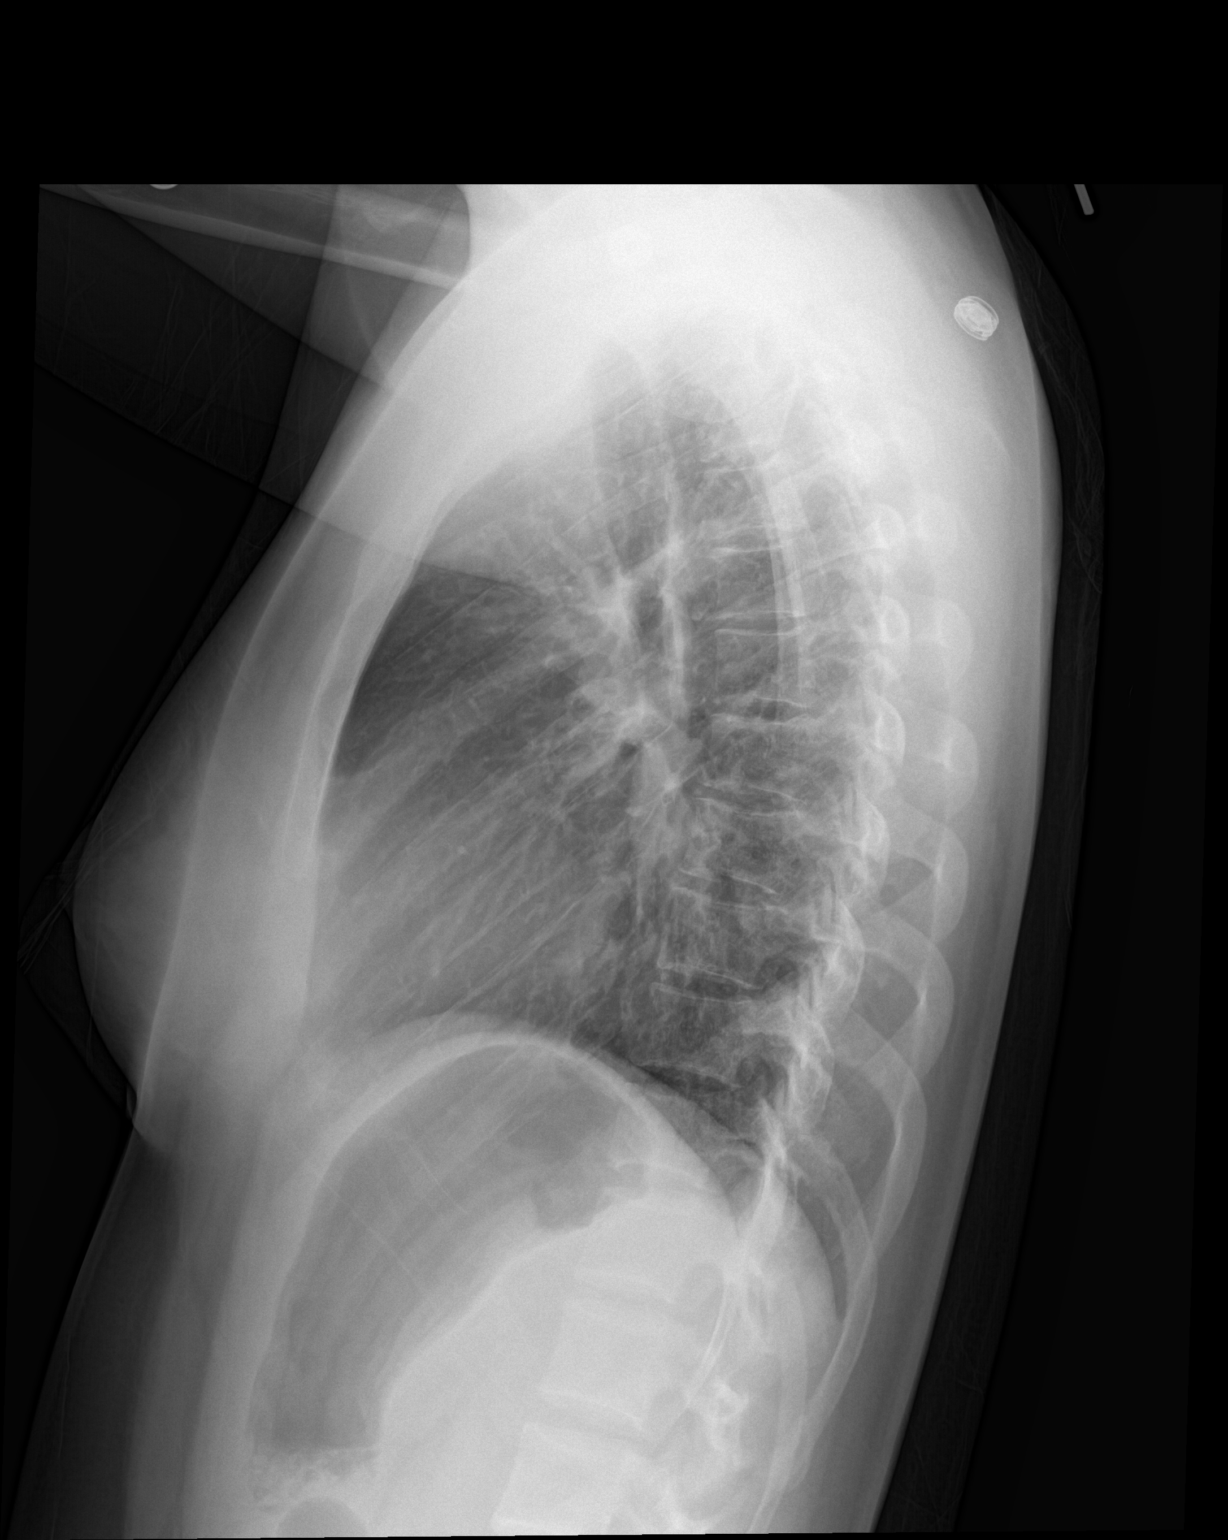

[2 of 2 positions shown; findings below may reference images not displayed]

FINDINGS: Bronchitic markings with subtle scattered reticular nodular density
bilaterally. Normal heart size and mediastinal contours. Atypical
pneumonia could have this pattern. Negative visualized skeleton.
IMPRESSION: Bronchitis with possible mild bronchopneumonia.
